# Patient Record
Sex: Male | Born: 1980 | Race: Black or African American | Hispanic: No | Marital: Single | Smoking: Former smoker
Health system: Southern US, Community
[De-identification: ages and names within clinical notes are randomized; demographics above are authoritative.]

## PROBLEM LIST (undated history)

## (undated) DIAGNOSIS — I10 Essential (primary) hypertension: Secondary | ICD-10-CM

## (undated) DIAGNOSIS — K219 Gastro-esophageal reflux disease without esophagitis: Secondary | ICD-10-CM

## (undated) HISTORY — PX: ANTERIOR CRUCIATE LIGAMENT REPAIR: SHX115

---

## 2015-02-15 ENCOUNTER — Emergency Department (HOSPITAL_COMMUNITY): Payer: Self-pay

## 2015-02-15 ENCOUNTER — Encounter (HOSPITAL_COMMUNITY): Payer: Self-pay | Admitting: Emergency Medicine

## 2015-02-15 ENCOUNTER — Emergency Department (HOSPITAL_COMMUNITY)
Admission: EM | Admit: 2015-02-15 | Discharge: 2015-02-15 | Disposition: A | Discharge status: Home / Self Care | Payer: Self-pay | Attending: Emergency Medicine | Admitting: Emergency Medicine

## 2015-02-15 DIAGNOSIS — Z87891 Personal history of nicotine dependence: Secondary | ICD-10-CM | POA: Insufficient documentation

## 2015-02-15 DIAGNOSIS — S6992XA Unspecified injury of left wrist, hand and finger(s), initial encounter: Secondary | ICD-10-CM | POA: Insufficient documentation

## 2015-02-15 DIAGNOSIS — M25511 Pain in right shoulder: Secondary | ICD-10-CM

## 2015-02-15 DIAGNOSIS — F1210 Cannabis abuse, uncomplicated: Secondary | ICD-10-CM | POA: Insufficient documentation

## 2015-02-15 DIAGNOSIS — R0781 Pleurodynia: Secondary | ICD-10-CM

## 2015-02-15 DIAGNOSIS — Y998 Other external cause status: Secondary | ICD-10-CM | POA: Insufficient documentation

## 2015-02-15 DIAGNOSIS — M79642 Pain in left hand: Secondary | ICD-10-CM

## 2015-02-15 DIAGNOSIS — Y9389 Activity, other specified: Secondary | ICD-10-CM | POA: Insufficient documentation

## 2015-02-15 DIAGNOSIS — S299XXA Unspecified injury of thorax, initial encounter: Secondary | ICD-10-CM | POA: Insufficient documentation

## 2015-02-15 DIAGNOSIS — Y92410 Unspecified street and highway as the place of occurrence of the external cause: Secondary | ICD-10-CM | POA: Insufficient documentation

## 2015-02-15 DIAGNOSIS — I10 Essential (primary) hypertension: Secondary | ICD-10-CM | POA: Insufficient documentation

## 2015-02-15 DIAGNOSIS — Z8719 Personal history of other diseases of the digestive system: Secondary | ICD-10-CM | POA: Insufficient documentation

## 2015-02-15 DIAGNOSIS — S4991XA Unspecified injury of right shoulder and upper arm, initial encounter: Secondary | ICD-10-CM | POA: Insufficient documentation

## 2015-02-15 HISTORY — DX: Essential (primary) hypertension: I10

## 2015-02-15 HISTORY — DX: Gastro-esophageal reflux disease without esophagitis: K21.9

## 2015-02-15 LAB — BASIC METABOLIC PANEL
Anion gap: 13 (ref 5–15)
BUN: 13 mg/dL (ref 6–20)
CO2: 20 mmol/L — ABNORMAL LOW (ref 22–32)
Calcium: 9 mg/dL (ref 8.9–10.3)
Chloride: 105 mmol/L (ref 101–111)
Creatinine, Ser: 0.96 mg/dL (ref 0.61–1.24)
GFR calc Af Amer: >60 mL/min (ref >60)
GFR calc non Af Amer: >60 mL/min (ref >60)
Glucose, Bld: 115 mg/dL — ABNORMAL HIGH (ref 65–99)
Potassium: 3.8 mmol/L (ref 3.5–5.1)
SODIUM: 138 mmol/L (ref 135–145)

## 2015-02-15 LAB — RAPID URINE DRUG SCREEN, HOSP PERFORMED
AMPHETAMINES: NOT DETECTED
Barbiturates: NOT DETECTED
Benzodiazepines: NOT DETECTED
Cocaine: NOT DETECTED
Opiates: NOT DETECTED
TETRAHYDROCANNABINOL: POSITIVE — AB

## 2015-02-15 LAB — CBC WITH DIFFERENTIAL/PLATELET
BASOS ABS: 0 10*3/uL (ref 0.0–0.1)
BASOS PCT: 0 % (ref 0–1)
EOS ABS: 0 10*3/uL (ref 0.0–0.7)
EOS PCT: 0 % (ref 0–5)
HEMATOCRIT: 44.4 % (ref 39.0–52.0)
HEMOGLOBIN: 14.8 g/dL (ref 13.0–17.0)
Lymphocytes Relative: 19 % (ref 12–46)
Lymphs Abs: 2.7 10*3/uL (ref 0.7–4.0)
MCH: 29.5 pg (ref 26.0–34.0)
MCHC: 33.3 g/dL (ref 30.0–36.0)
MCV: 88.6 fL (ref 78.0–100.0)
Monocytes Absolute: 1.1 10*3/uL — ABNORMAL HIGH (ref 0.1–1.0)
Monocytes Relative: 8 % (ref 3–12)
Neutro Abs: 9.8 10*3/uL — ABNORMAL HIGH (ref 1.7–7.7)
Neutrophils Relative %: 73 % (ref 43–77)
Platelets: 397 10*3/uL (ref 150–400)
RBC: 5.01 MIL/uL (ref 4.22–5.81)
RDW: 14.3 % (ref 11.5–15.5)
WBC: 13.6 10*3/uL — ABNORMAL HIGH (ref 4.0–10.5)

## 2015-02-15 LAB — ETHANOL: Alcohol, Ethyl (B): <5 mg/dL (ref <5)

## 2015-02-15 MED ORDER — NAPROXEN 250 MG PO TABS: 250.0000 mg | ORAL_TABLET | Freq: Two times a day (BID) | ORAL | Status: AC

## 2015-02-15 MED ORDER — ACETAMINOPHEN 325 MG PO TABS
650.0000 mg | ORAL_TABLET | Freq: Once | ORAL | Status: AC
  Administered 2015-02-15: 650 mg via ORAL
  Filled 2015-02-15: qty 2

## 2015-02-15 NOTE — ED Notes (Signed)
Pt ambulatory with steady gait to void in BR.  
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
## 2015-02-15 NOTE — Progress Notes (Signed)
CM spoke with pt who confirms self pay [place] resident with no pcp.  CM discussed and provided written information for self pay pcps, discussed the importance of pcp vs EDP services for f/u care, www.needymeds.org, www.goodrx.com, discounted pharmacies and other [place] resources such as [facility] , [facility], affordable care act,  [facility], financial assistance, self pay dental services, [place] med assist, DSS and  health department  Reviewed resources for [place] self pay pcps like [facility], [facility] [address], [facility], [facility], local urgent care centers, [facility], [facility], general medical clinics, [facility], [facility] plus others, medication resources, [facility] out patient pharmacies and housing Pt voiced understanding and appreciation of resources provided   Provided [facility] contact information Pt agreed to a referral Cm completed referral Pt to be contact by [facility] clinical liason 
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
## 2015-02-15 NOTE — ED Notes (Signed)
Pt to radiology.

## 2016-01-08 ENCOUNTER — Encounter (HOSPITAL_COMMUNITY): Payer: Self-pay | Admitting: Emergency Medicine

## 2016-01-08 ENCOUNTER — Emergency Department (HOSPITAL_COMMUNITY)
Admission: EM | Admit: 2016-01-08 | Discharge: 2016-01-08 | Disposition: A | Discharge status: Home / Self Care | Payer: No Typology Code available for payment source | Attending: Emergency Medicine | Admitting: Emergency Medicine

## 2016-01-08 DIAGNOSIS — Z8719 Personal history of other diseases of the digestive system: Secondary | ICD-10-CM | POA: Insufficient documentation

## 2016-01-08 DIAGNOSIS — I10 Essential (primary) hypertension: Secondary | ICD-10-CM | POA: Insufficient documentation

## 2016-01-08 DIAGNOSIS — L299 Pruritus, unspecified: Secondary | ICD-10-CM | POA: Insufficient documentation

## 2016-01-08 DIAGNOSIS — Z87891 Personal history of nicotine dependence: Secondary | ICD-10-CM | POA: Insufficient documentation

## 2016-01-08 DIAGNOSIS — Z791 Long term (current) use of non-steroidal anti-inflammatories (NSAID): Secondary | ICD-10-CM | POA: Insufficient documentation

## 2016-01-08 DIAGNOSIS — R21 Rash and other nonspecific skin eruption: Secondary | ICD-10-CM | POA: Insufficient documentation

## 2016-01-08 MED ORDER — DEXAMETHASONE SODIUM PHOSPHATE 10 MG/ML IJ SOLN
10.0000 mg | Freq: Once | INTRAMUSCULAR | Status: AC
  Administered 2016-01-08: 10 mg via INTRAMUSCULAR
  Filled 2016-01-08: qty 1

## 2016-01-08 NOTE — ED Notes (Signed)
MD at bedside. 

## 2016-01-08 NOTE — ED Provider Notes (Signed)
CSN: [id number]     Arrival date & time 01/08/16  0751 History   First MD Initiated Contact with Patient 01/08/16 0759     Chief Complaint  Patient presents with  . Rash      HPI Pt c/o itching over entire body. States he woke up with head itching 9 days ago and continually worsened to whole body. Small rash appears under his right armpit. States he switched different soap and shampoo recently Past Medical History  Diagnosis Date  . Hypertension   . GERD (gastroesophageal reflux disease)    Past Surgical History  Procedure Laterality Date  . Anterior cruciate ligament repair     No family history on file. Social History  Substance Use Topics  . Smoking status: Former Games developermoker  . Smokeless tobacco: None  . Alcohol Use: No    Review of Systems  All other systems reviewed and are negative.     Allergies  Review of patient's allergies indicates no known allergies.  Home Medications   Prior to Admission medications   Medication Sig Start Date End Date Taking? Authorizing Provider  naproxen (NAPROSYN) 250 MG tablet Take 1 tablet (250 mg total) by mouth 2 (two) times daily with a meal. 02/15/15   Everlene FarrierWilliam Dansie, PA-C   BP 160/96 mmHg  Pulse 81  Temp(Src) 98.9 F (37.2 C) (Oral)  Resp 18  SpO2 98% Physical Exam  Constitutional: He is oriented to person, place, and time. He appears well-developed and well-nourished. No distress.  HENT:  Head: Normocephalic and atraumatic.  No evidence of head lice  Eyes: Pupils are equal, round, and reactive to light.  Neck: Normal range of motion.  Cardiovascular: Normal rate and intact distal pulses.   Pulmonary/Chest: No respiratory distress.  Abdominal: Normal appearance. He exhibits no distension.  Musculoskeletal: Normal range of motion.  Neurological: He is alert and oriented to person, place, and time. No cranial nerve deficit.  Skin: Skin is warm and dry. Rash noted.  Small macular rash noted under right arm.  Psychiatric:  He has a normal mood and affect. His behavior is normal.  Nursing note and vitals reviewed.   ED Course  Procedures (including critical care time) Medications  dexamethasone (DECADRON) injection 10 mg (not administered)    Labs Review Labs Reviewed - No data to display  Imaging Review No results found. I have personally reviewed and evaluated these images and lab results as part of my medical decision-making.    MDM   Final diagnoses:  Rash  Itching        Nelva Nayobert Tylee Newby, MD 01/08/16 [phone]

## 2016-01-08 NOTE — ED Notes (Signed)
Pt c/o itching over entire body. States he woke up with head itching 9 days ago and continually worsened to whole body. Small rash appears under his right armpit. States he switched different soap and shampoo recently.

## 2016-01-08 NOTE — Discharge Instructions (Signed)
Pruritus °Pruritus is an itching feeling. There are many different conditions and factors that can make your skin itchy. Dry skin is one of the most common causes of itching. Most cases of itching do not require medical attention. Itchy skin can turn into a rash.  °HOME CARE INSTRUCTIONS  °Watch your pruritus for any changes. Take these steps to help with your condition:  °Skin Care °· Moisturize your skin as needed. A moisturizer that contains petroleum jelly is best for keeping moisture in your skin. °· Take or apply medicines only as directed by your health care provider. This may include: °¨ Corticosteroid cream. °¨ Anti-itch lotions. °¨ Oral anti-histamines. °· Apply cool compresses to the affected areas. °· Try taking a bath with: °¨ Epsom salts. Follow the instructions on the packaging. You can get these at your local pharmacy or grocery store. °¨ Baking soda. Pour a small amount into the bath as directed by your health care provider. °¨ Colloidal oatmeal. Follow the instructions on the packaging. You can get this at your local pharmacy or grocery store. °· Try applying baking soda paste to your skin. Stir water into baking soda until it reaches a paste-like consistency.   °· Do not scratch your skin. °· Avoid hot showers or baths, which can make itching worse. A cold shower may help with itching as long as you use a moisturizer after. °· Avoid scented soaps, detergents, and perfumes. Use gentle soaps, detergents, perfumes, and other cosmetic products. °General Instructions °· Avoid wearing tight clothes. °· Keep a journal to help track what causes your itch. Write down: °¨ What you eat. °¨ What cosmetic products you use. °¨ What you drink. °¨ What you wear. This includes jewelry. °· Use a humidifier. This keeps the air moist, which helps to prevent dry skin. °SEEK MEDICAL CARE IF: °· The itching does not go away after several days. °· You sweat at night. °· You have weight loss. °· You are unusually  thirsty. °· You urinate more than normal. °· You are more tired than normal. °· You have abdominal pain. °· Your skin tingles. °· You feel weak. °· Your skin or the whites of your eyes look yellow (jaundice). °· Your skin feels numb. °  °This information is not intended to replace advice given to you by your health care provider. Make sure you discuss any questions you have with your health care provider. °  °Document Released: 05/22/2011 Document Revised: 01/24/2015 Document Reviewed: 09/05/2014 °Elsevier Interactive Patient Education ©2016 Elsevier Inc. ° °
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
